# Patient Record
Sex: Female | Born: 2000 | Race: Black or African American | Hispanic: No | Marital: Single | Smoking: Never smoker
Health system: Southern US, Community
[De-identification: ages and names within clinical notes are randomized; demographics above are authoritative.]

---
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
## 2022-01-06 ENCOUNTER — Emergency Department
Admission: EM | Admit: 2022-01-06 | Discharge: 2022-01-06 | Disposition: A | Discharge status: Home / Self Care | Payer: Federal, State, Local not specified - PPO | Source: Home / Self Care | Attending: Family Medicine | Admitting: Family Medicine

## 2022-01-06 ENCOUNTER — Emergency Department (INDEPENDENT_AMBULATORY_CARE_PROVIDER_SITE_OTHER): Payer: Federal, State, Local not specified - PPO

## 2022-01-06 ENCOUNTER — Other Ambulatory Visit: Payer: Self-pay

## 2022-01-06 DIAGNOSIS — R109 Unspecified abdominal pain: Secondary | ICD-10-CM

## 2022-01-06 DIAGNOSIS — R1084 Generalized abdominal pain: Secondary | ICD-10-CM

## 2022-01-06 DIAGNOSIS — R110 Nausea: Secondary | ICD-10-CM | POA: Diagnosis not present

## 2022-01-06 DIAGNOSIS — K5900 Constipation, unspecified: Secondary | ICD-10-CM

## 2022-01-06 MED ORDER — POLYETHYLENE GLYCOL 3350 17 G PO PACK: 17.0000 g | PACK | Freq: Every day | ORAL | 0 refills | Status: DC

## 2022-01-06 MED ORDER — ONDANSETRON HCL 8 MG PO TABS: 8.0000 mg | ORAL_TABLET | Freq: Three times a day (TID) | ORAL | 0 refills | Status: DC | PRN

## 2022-01-06 NOTE — Discharge Instructions (Addendum)
Start taking MiraLAX once a day every day It is available over-the-counter.  For now I would continue 2-week supply.  The goal is to regulate your bowels (even if you only have a movement every few days) You may cut back on this if you develop loose bowels Take Zofran as needed for nausea Make sure you drink lots of water Increase fiber in your diet See your primary care doctor if bowel problems persist

## 2022-01-06 NOTE — ED Provider Notes (Signed)
[facility]    CSN: [id number] Arrival date & time: 01/06/22  0803      History   Chief Complaint Chief Complaint  Patient presents with   Constipation    X3 weeks  Constipation and nausea.    HPI Brooke Meyer is a 21 y.o. female.   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  States that she is always had irregular bowel habits.  She states that it is not unusual for her to go 1 week in between bowel movements.  She states that this is normal for her, no pain.  For the last 3 weeks she has been having constipation.  She noticed that she missed her weekly bowel movements and started to feel uncomfortable.  She states that she was having abdominal crampy pain and nausea.  She took MiraLAX which did not work.  Her mother gave her a laxative tablet which made her have cramping, she vomited 1 time, but did have a bowel movement.  She has not felt right since.  She has continued to have waves of nausea.  Continue to have crampy abdominal pain.  Continue to have irregular bowel movements.  Is here for evaluation of constipation. No blood or mucus in the bowels She is on no medications or supplements that would cause constipation States she has not changed her diet or water intake Denies fever.  No abdominal surgeries Patient has had a menstrual period within the last 3 weeks, and did a pregnancy test at home that was negative  History reviewed. No pertinent past medical history.  There are no problems to display for this patient.   History reviewed. No pertinent surgical history.  OB History   No obstetric history on file.      Home Medications    Prior to Admission medications   Medication Sig Start Date End Date Taking? Authorizing Provider  ondansetron (ZOFRAN) 8 MG tablet Take 1 tablet (8 mg total) by mouth every 8 (eight) hours as needed for nausea or vomiting. 01/06/22  Yes Eustace Moore, MD  polyethylene glycol (MIRALAX /  GLYCOLAX) 17 g packet Take 17 g by mouth daily. 01/06/22  Yes Eustace Moore, MD  ibuprofen (ADVIL) 200 MG tablet Take three tablets ( 600 milligrams total) every 6 with food as needed for pain. 08/06/19   Lajean Manes, MD    Family History History reviewed. No pertinent family history.  Social History Social History   Tobacco Use   Smoking status: Never   Smokeless tobacco: Never  Vaping Use   Vaping Use: Never used  Substance Use Topics   Alcohol use: Yes     Allergies   Patient has no known allergies.   Review of Systems Review of Systems See HPI  Physical Exam Triage Vital Signs ED Triage Vitals  Enc Vitals Group     BP 01/06/22 0822 134/83     Pulse Rate 01/06/22 0822 81     Resp 01/06/22 0822 20     Temp 01/06/22 0822 98.3 F (36.8 C)     Temp Source 01/06/22 0822 Oral     SpO2 01/06/22 0822 98 %     Weight 01/06/22 0819 180 lb (81.6 kg)     Height 01/06/22 0819 5\' 9"  (1.753 m)     Head Circumference --      Peak Flow --      Pain Score 01/06/22 0819 0     Pain Loc --  Pain Edu? --      Excl. in GC? --    No data found.  Updated Vital Signs BP 134/83 (BP Location: Right Arm)    Pulse 81    Temp 98.3 F (36.8 C) (Oral)    Resp 20    Ht 5\' 9"  (1.753 m)    Wt 81.6 kg    LMP 12/31/2021    SpO2 98%    BMI 26.58 kg/m        Physical Exam Constitutional:      General: She is not in acute distress.    Appearance: She is well-developed and normal weight. She is not ill-appearing.  HENT:     Head: Normocephalic and atraumatic.     Mouth/Throat:     Comments: Mask is in place Eyes:     Conjunctiva/sclera: Conjunctivae normal.     Pupils: Pupils are equal, round, and reactive to light.  Cardiovascular:     Rate and Rhythm: Normal rate and regular rhythm.     Heart sounds: Normal heart sounds.  Pulmonary:     Effort: Pulmonary effort is normal. No respiratory distress.     Breath sounds: Normal breath sounds.  Abdominal:     General: Abdomen  is flat. Bowel sounds are normal. There is no distension.     Palpations: Abdomen is soft.     Tenderness: There is abdominal tenderness.     Comments: Generalized tenderness to deep palpation throughout abdomen.  No organomegaly.  No palpable mass  Musculoskeletal:        General: Normal range of motion.     Cervical back: Normal range of motion and neck supple.  Skin:    General: Skin is warm and dry.  Neurological:     Mental Status: She is alert.  Psychiatric:        Mood and Affect: Mood normal.        Behavior: Behavior normal.     UC Treatments / Results  Labs (all labs ordered are listed, but only abnormal results are displayed) Labs Reviewed - No data to display  EKG   Radiology DG Abdomen Acute W/Chest  Result Date: 01/06/2022 CLINICAL DATA:  Abdominal pain and nausea EXAM: DG ABDOMEN ACUTE WITH 1 VIEW CHEST COMPARISON:  None. FINDINGS: There is no evidence of dilated bowel loops or free intraperitoneal air. No radiopaque calculi or other significant radiographic abnormality is seen. Heart size and mediastinal contours are within normal limits. Both lungs are clear. IMPRESSION: Negative abdominal radiographs.  No acute cardiopulmonary disease. Electronically Signed   By: 03/06/2022.  Shick M.D.   On: 01/06/2022 09:02    Procedures Procedures (including critical care time)  Medications Ordered in UC Medications - No data to display  Initial Impression / Assessment and Plan / UC Course  I have reviewed the triage vital signs and the nursing notes.  Pertinent labs & imaging results that were available during my care of the patient were reviewed by me and considered in my medical decision making (see chart for details).     Discussed management of constipation.  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  She may have irritable bowel with constipation.  Recommend follow-up with primary care Final Clinical Impressions(s) / UC Diagnoses   Final diagnoses:  Generalized  abdominal pain  Constipation, unspecified constipation type     Discharge Instructions      Start taking MiraLAX once a day every day It is available over-the-counter.  For now I  would continue 2-week supply.  The goal is to regulate your bowels (even if you only have a movement every few days) You may cut back on this if you develop loose bowels Take Zofran as needed for nausea Make sure you drink lots of water Increase fiber in your diet See your primary care doctor if bowel problems persist   ED Prescriptions     Medication Sig Dispense Auth. Provider   polyethylene glycol (MIRALAX / GLYCOLAX) 17 g packet Take 17 g by mouth daily. 14 each Eustace MooreNelson, Jamyra Zweig Sue, MD   ondansetron (ZOFRAN) 8 MG tablet Take 1 tablet (8 mg total) by mouth every 8 (eight) hours as needed for nausea or vomiting. 20 tablet Eustace MooreNelson, Daissy Yerian Sue, MD      PDMP not reviewed this encounter.   Eustace MooreNelson, Jhania Etherington Sue, MD 01/06/22 1012

## 2022-01-06 NOTE — ED Triage Notes (Signed)
Pt states that she has some constipation and nausea. X3 weeks   Pt states that she has taken a pregnancy on 1/4 which  was negative.  Pt states that her bowels are irregular.

## 2022-09-14 ENCOUNTER — Encounter: Payer: Self-pay | Admitting: Allergy

## 2022-09-14 ENCOUNTER — Ambulatory Visit: Payer: Federal, State, Local not specified - PPO | Admitting: Allergy

## 2022-09-14 VITALS — BP 120/80 | HR 69 | Temp 98.1°F | Resp 16 | Ht 69.5 in | Wt 133.0 lb

## 2022-09-14 DIAGNOSIS — R21 Rash and other nonspecific skin eruption: Secondary | ICD-10-CM | POA: Diagnosis not present

## 2022-09-14 DIAGNOSIS — T781XXA Other adverse food reactions, not elsewhere classified, initial encounter: Secondary | ICD-10-CM

## 2022-09-14 DIAGNOSIS — L509 Urticaria, unspecified: Secondary | ICD-10-CM | POA: Diagnosis not present

## 2022-09-14 DIAGNOSIS — T781XXD Other adverse food reactions, not elsewhere classified, subsequent encounter: Secondary | ICD-10-CM

## 2022-09-14 DIAGNOSIS — J302 Other seasonal allergic rhinitis: Secondary | ICD-10-CM | POA: Insufficient documentation

## 2022-09-14 DIAGNOSIS — T7840XA Allergy, unspecified, initial encounter: Secondary | ICD-10-CM | POA: Insufficient documentation

## 2022-09-14 DIAGNOSIS — T7840XD Allergy, unspecified, subsequent encounter: Secondary | ICD-10-CM

## 2022-09-14 DIAGNOSIS — J3089 Other allergic rhinitis: Secondary | ICD-10-CM

## 2022-09-14 MED ORDER — EPINEPHRINE 0.3 MG/0.3ML IJ SOAJ: 0.3000 mg | INTRAMUSCULAR | 1 refills | Status: AC | PRN

## 2022-09-14 NOTE — Assessment & Plan Note (Signed)
Minor rhinitis symptoms in the spring and fall.  Today's skin testing showed: Positive to grass, trees, weed, mold, cat, dog.  Start environmental control measures as below.  Use over the counter antihistamines such as Zyrtec (cetirizine), Claritin (loratadine), Allegra (fexofenadine), or Xyzal (levocetirizine) daily as needed. May take twice a day during allergy flares. May switch antihistamines every few months.  Consider allergy injections for long term control if above medications do not help the symptoms - handout given.   Patient wants to get a dog.

## 2022-09-14 NOTE — Progress Notes (Signed)
New Patient Note  RE: Brooke Meyer MRN: [id number] DOB: [birth year] Date of Office Visit: 09/14/2022  Consult requested by: Deloris Ping, NP Primary care provider: Pcp, No  Chief Complaint: Allergic Reaction (Random reactions to random foods did have throat closing to gatorade)  History of Present Illness: I had the pleasure of seeing Brooke Meyer for initial evaluation at the [facility] on 09/14/2022. She is a 21 y.o. female, who is referred here by Fredirick Maudlin, NP for the evaluation of allergic reactions.  Patient had pancakes, syrup, coffee for breakfast in September and about 30 minutes afterwards she had lip swelling. She had some facial itching/erythema.  These are foods that she usually eats daily but since then she has not had it.   Symptoms resolved within 2 hours after taking zyrtec.  Denies any changes in diet, meds, personal care products.  She also noted trouble breathing/throat tightness with Gatorade. This started happening about 2 years ago. She had 2 episodes with Gatorade. She usually feels better after 1 day. No recent Gatorade/powerade.  She tolerates food dyes in candy with no issues.   Past work up includes: none. Dietary History: patient has been eating other foods including milk, eggs, peanut, treenuts, sesame, shellfish, fish, soy, wheat, meats, fruits and vegetables.  She reports reading labels and avoiding Gatorade/powerade in diet completely.   Previous work up includes: none. Previous history of swelling: no. Family history of angioedema: no. Ace-inhibitor use: no  Assessment and Plan: Brooke Meyer is a 21 y.o. female with: Allergic reaction Lip angioedema with facial itching/rash after eating breakfast. Symptoms resolved within 2 hours after taking zyrtec. Denies changes in diet, meds, personal are products. No prior lip angioedema. Noted trouble breathing/throat tightness with any Gatorade drinks but tolerates food dyes with no  issues. Sometimes also breaks out on her face with no known triggers. Concerned about allergies. Today's skin testing showed: Positive to grass, trees, weed, mold, cat, dog. Negative to food panel. Not sure what caused the above reaction.  Keep track of episodes and take pictures. See below for proper skin care.  Take zyrtec 10mg  one a day at night.  If becoming more frequent then get bloodwork drawn. Start strict avoidance of powerade/gatorade. I don't know what ingredient you are sensitive to as some of the chemicals/additives I don't have a test for. I have prescribed epinephrine injectable device and demonstrated proper use. For mild symptoms you can take over the counter antihistamines such as Benadryl and monitor symptoms closely. If symptoms worsen or if you have severe symptoms including breathing issues, throat closure, significant swelling, whole body hives, severe diarrhea and vomiting, lightheadedness then inject epinephrine and seek immediate medical care afterwards. Emergency action plan given.  Other allergic rhinitis Minor rhinitis symptoms in the spring and fall. Today's skin testing showed: Positive to grass, trees, weed, mold, cat, dog. Start environmental control measures as below. Use over the counter antihistamines such as Zyrtec (cetirizine), Claritin (loratadine), Allegra (fexofenadine), or Xyzal (levocetirizine) daily as needed. May take twice a day during allergy flares. May switch antihistamines every few months. Consider allergy injections for long term control if above medications do not help the symptoms - handout given.  Patient wants to get a dog.   Return in about 6 months (around 03/15/2023).  Meds ordered this encounter  Medications   EPINEPHrine 0.3 mg/0.3 mL IJ SOAJ injection    Sig: Inject 0.3 mg into the muscle as needed for anaphylaxis.  Dispense:  2 each    Refill:  1    May dispense generic/Mylan/Teva brand.   Lab Orders         ANA w/Reflex          Alpha-Gal Panel         C3 and C4         CBC with Differential/Platelet         Chronic Urticaria         Comprehensive metabolic panel         C-reactive protein         Sedimentation rate         Thyroid Cascade Profile         Tryptase      Other allergy screening: Asthma:  Uses albuterol as needed on rare occasions. Last use was about 2 years ago.  Rhino conjunctivitis: yes Sneezing around spring and fall.  Does not take any medications for this. No prior testing.  Medication allergy: no Hymenoptera allergy: no Urticaria: yes Breaking out on the face and usually happens once per week.  It happens more so often after she is eating.  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  History of recurrent infections suggestive of immunodeficency: no  Diagnostics: Skin Testing: Environmental allergy panel and foods. Positive to grass, trees, weed, mold, cat, dog. Negative to food panel. Results discussed with patient/family.  Airborne Adult Perc - 09/14/22 1134     Time Antigen Placed 1135    Allergen Manufacturer Waynette ButteryGreer    Location Back    Number of Test 59    1. Control-Buffer 50% Glycerol Negative    2. Control-Histamine 1 mg/ml 2+    3. Albumin saline Negative    4. Bahia Negative    5. French Southern TerritoriesBermuda Negative    6. Johnson Negative    7. Kentucky Blue Negative    8. Meadow Fescue Negative    9. Perennial Rye Negative    10. Sweet Vernal Negative    11. Timothy Negative    12. Cocklebur Negative    13. Burweed Marshelder Negative    14. Ragweed, short Negative    15. Ragweed, Giant Negative    16. Plantain,  English Negative    17. Lamb's Quarters Negative    18. Sheep Sorrell Negative    19. Rough Pigweed Negative    20. Marsh Elder, Rough Negative    21. Mugwort, Common Negative    22. Ash mix Negative    23. Birch mix Negative    24. Beech American Negative    25. Box, Elder 2+    26. Cedar, red Negative    27.  Cottonwood, Guinea-BissauEastern Negative    28. Elm mix Negative    29. Hickory Negative    30. Maple mix Negative    31. Oak, Guinea-BissauEastern mix 4+    32. Pecan Pollen --   +/-   33. Pine mix Negative    34. Sycamore Eastern Negative    35. Walnut, Black Pollen --   +/-   36. Alternaria alternata 2+    37. Cladosporium Herbarum 2+    38. Aspergillus mix --   +/-   39. Penicillium mix Negative    40. Bipolaris sorokiniana (Helminthosporium) Negative    41. Drechslera spicifera (Curvularia) 2+    42. Mucor plumbeus Negative    43. Fusarium moniliforme 3+    44. Aureobasidium pullulans (pullulara) Negative    45.  Rhizopus oryzae Negative    46. Botrytis cinera Negative    47. Epicoccum nigrum 2+    48. Phoma betae 3+    49. Candida Albicans Negative    50. Trichophyton mentagrophytes Negative    51. Mite, D Farinae  5,000 AU/ml Negative    52. Mite, D Pteronyssinus  5,000 AU/ml Negative    53. Cat Hair 10,000 BAU/ml 2+    54.  Dog Epithelia Negative    55. Mixed Feathers Negative    56. Horse Epithelia Negative    57. Cockroach, German Negative    58. Mouse Negative    59. Tobacco Leaf Negative             Intradermal - 09/14/22 1214     Time Antigen Placed 1215    Allergen Manufacturer Waynette Buttery    Location Arm    Number of Test 9    Control Negative    French Southern Territ[place] 2+    Johnson 2+    7 Grass Negative    Ragweed mix Negative    Weed mix 2+    Dog 2+    Cockroach Negative    Mite mix Negative             Food Adult Perc - 09/14/22 1100     Time Antigen Placed 1135    Allergen Manufacturer Waynette Buttery    Location Back    Number of allergen test 72    1. Peanut Negative    2. Soybean Negative    3. Wheat Negative    4. Sesame Negative    5. Milk, cow Negative    6. Egg White, Chicken Negative    7. Casein Negative    8. Shellfish Mix Negative    9. Fish Mix Negative    10. Cashew Negative    11. Pecan Food Negative    12. Walnut Food Negative    13. Almond Negative    14.  Hazelnut Negative    15. [place] nut Negative    16. Coconut Negative    17. Pistachio Negative    18. Catfish Negative    19. Bass Negative    20. Trout Negative    21. Tuna Negative    22. Salmon Negative    23. Flounder Negative    24. Codfish Negative    25. Shrimp Negative    26. Crab Negative    27. Lobster Negative    28. Oyster Negative    29. Scallops Negative    30. Barley Negative    31. Oat  Negative    32. Rye  Negative    33. Hops Negative    34. Rice Negative    35. Cottonseed Negative    36. Saccharomyces Cerevisiae  Negative    37. Pork Negative    38. [place] Meat Negative    39. Chicken Meat Negative    40. Beef Negative    41. Lamb Negative    42. Tomato Negative    43. White Potato Negative    44. Sweet Potato Negative    45. Pea, Green/English Negative    46. Navy Bean Negative    47. Mushrooms Negative    48. Avocado Negative    49. Onion Negative    50. Cabbage Negative    51. Carrots Negative    52. Celery Negative    53. Corn Negative    54. Cucumber Negative    55. Grape (White seedless) Negative  56. Orange  Negative    57. Banana Negative    58. Apple Negative    59. Peach Negative    60. Strawberry Negative    61. Cantaloupe Negative    62. Watermelon Negative    63. Pineapple Negative    64. Chocolate/Cacao bean Negative    65. Karaya Gum Negative    66. Acacia (Arabic Gum) Negative    67. Cinnamon Negative    68. Nutmeg Negative    69. Ginger Negative    70. Garlic Negative    71. Pepper, black Negative    72. Mustard Negative             Past Medical History: Patient Active Problem List   Diagnosis Date Noted   Allergic reaction 09/14/2022   Other allergic rhinitis 09/14/2022   History reviewed. No pertinent past medical history. Past Surgical History: History reviewed. No pertinent surgical history. Medication List:  Current Outpatient Medications  Medication Sig Dispense Refill   EPINEPHrine 0.3 mg/0.3 mL  IJ SOAJ injection Inject 0.3 mg into the muscle as needed for anaphylaxis. 2 each 1   ibuprofen (ADVIL) 200 MG tablet Take three tablets ( 600 milligrams total) every 6 with food as needed for pain. 30 tablet 0   No current facility-administered medications for this visit.   Allergies: No Known Allergies Social History: Social History   Socioeconomic History   Marital status: Single    Spouse name: Not on file   Number of children: Not on file   Years of education: Not on file   Highest education level: Not on file  Occupational History   Not on file  Tobacco Use   Smoking status: Never    Passive exposure: Never   Smokeless tobacco: Never  Vaping Use   Vaping Use: Never used  Substance and Sexual Activity   Alcohol use: Never   Drug use: Not on file   Sexual activity: Not on file  Other Topics Concern   Not on file  Social History Narrative   Not on file   Social Determinants of Health   Financial Resource Strain: Not on file  Food Insecurity: Not on file  Transportation Needs: Not on file  Physical Activity: Not on file  Stress: Not on file  Social Connections: Not on file   Lives in a 20 year old house. Smoking: denies Occupation: Scientist, clinical (histocompatibility and immunogenetics) HistorySurveyor, minerals in the house: no Engineer, civil (consulting) in the family room: yes Carpet in the bedroom: no Heating: gas Cooling: central Pet: no  Family History: Family History  Problem Relation Age of Onset   Food Allergy Maternal Uncle    Allergic rhinitis Neg Hx    Angioedema Neg Hx    Asthma Neg Hx    Atopy Neg Hx    Eczema Neg Hx    Immunodeficiency Neg Hx    Urticaria Neg Hx    Review of Systems  Constitutional:  Negative for appetite change, chills, fever and unexpected weight change.  HENT:  Negative for congestion and rhinorrhea.   Eyes:  Negative for itching.  Respiratory:  Negative for cough, chest tightness, shortness of breath and wheezing.   Cardiovascular:  Negative for chest  pain.  Gastrointestinal:  Negative for abdominal pain.  Genitourinary:  Negative for difficulty urinating.  Skin:  Positive for rash.  Allergic/Immunologic: Positive for environmental allergies.  Neurological:  Negative for headaches.    Objective: BP 120/80   Pulse 69   Temp 98.1  F (36.7 C) (Temporal)   Resp 16   Ht 5' 9.5" (1.765 m)   Wt 133 lb (60.3 kg)   SpO2 99%   BMI 19.36 kg/m  Body mass index is 19.36 kg/m. Physical Exam Vitals and nursing note reviewed.  Constitutional:      Appearance: Normal appearance. She is well-developed.  HENT:     Head: Normocephalic and atraumatic.     Right Ear: External ear normal. There is impacted cerumen.     Left Ear: External ear normal. There is impacted cerumen.     Nose: Nose normal.     Mouth/Throat:     Mouth: Mucous membranes are moist.     Pharynx: Oropharynx is clear.  Eyes:     Conjunctiva/sclera: Conjunctivae normal.  Cardiovascular:     Rate and Rhythm: Normal rate and regular rhythm.     Heart sounds: Normal heart sounds. No murmur heard.    No friction rub. No gallop.  Pulmonary:     Effort: Pulmonary effort is normal.     Breath sounds: Normal breath sounds. No wheezing, rhonchi or rales.  Musculoskeletal:     Cervical back: Neck supple.  Skin:    General: Skin is warm.     Findings: No rash.  Neurological:     Mental Status: She is alert and oriented to person, place, and time.  Psychiatric:        Behavior: Behavior normal.    The plan was reviewed with the patient/family, and all questions/concerned were addressed.  It was my pleasure to see Brooke Meyer today and participate in her care. Please feel free to contact me with any questions or concerns.  Sincerely,  Rexene Alberts, DO Allergy & Immunology  [facility] office: [place] office: [phone]

## 2022-09-14 NOTE — Patient Instructions (Addendum)
Today's skin testing showed: Positive to grass, trees, weed, mold, cat, dog. Negative to food panel.  Results given.  Lip welling/facial rash Not sure what caused the symptoms.  Keep track of episodes and take pictures. See below for proper skin care.  Take zyrtec 10mg  one a day at night.  If becoming more frequent then get bloodwork drawn.  Food allergies Start strict avoidance of powerade/gatorade. I don't know what ingredient you are sensitive to as some of the chemicals/additives I don't have a test for. I have prescribed epinephrine injectable device and demonstrated proper use. For mild symptoms you can take over the counter antihistamines such as Benadryl and monitor symptoms closely. If symptoms worsen or if you have severe symptoms including breathing issues, throat closure, significant swelling, whole body hives, severe diarrhea and vomiting, lightheadedness then inject epinephrine and seek immediate medical care afterwards. Emergency action plan given.  Environmental allergies Start environmental control measures as below. Use over the counter antihistamines such as Zyrtec (cetirizine), Claritin (loratadine), Allegra (fexofenadine), or Xyzal (levocetirizine) daily as needed. May take twice a day during allergy flares. May switch antihistamines every few months. Consider allergy injections for long term control if above medications do not help the symptoms - handout given.   Follow up in 6 months or sooner if needed.    Skin care recommendations  Bath time: Always use lukewarm water. AVOID very hot or cold water. Keep bathing time to 5-10 minutes. Do NOT use bubble bath. Use a mild soap and use just enough to wash the dirty areas. Do NOT scrub skin vigorously.  After bathing, pat dry your skin with a towel. Do NOT rub or scrub the skin.  Moisturizers and prescriptions:  ALWAYS apply moisturizers immediately after bathing (within 3 minutes). This helps to lock-in  moisture. Use the moisturizer several times a day over the whole body. Good summer moisturizers include: Aveeno, CeraVe, Cetaphil. Good winter moisturizers include: Aquaphor, Vaseline, Cerave, Cetaphil, Eucerin, Vanicream. When using moisturizers along with medications, the moisturizer should be applied about one hour after applying the medication to prevent diluting effect of the medication or moisturize around where you applied the medications. When not using medications, the moisturizer can be continued twice daily as maintenance.  Laundry and clothing: Avoid laundry products with added color or perfumes. Use unscented hypo-allergenic laundry products such as Tide free, Cheer free & gentle, and All free and clear.  If the skin still seems dry or sensitive, you can try double-rinsing the clothes. Avoid tight or scratchy clothing such as wool. Do not use fabric softeners or dyer sheets.  Reducing Pollen Exposure Pollen seasons: trees (spring), grass (summer) and ragweed/weeds (fall). Keep windows closed in your home and car to lower pollen exposure.  Install air conditioning in the bedroom and throughout the house if possible.  Avoid going out in dry windy days - especially early morning. Pollen counts are highest between 5 - 10 AM and on dry, hot and windy days.  Save outside activities for late afternoon or after a heavy rain, when pollen levels are lower.  Avoid mowing of grass if you have grass pollen allergy. Be aware that pollen can also be transported indoors on people and pets.  Dry your clothes in an automatic dryer rather than hanging them outside where they might collect pollen.  Rinse hair and eyes before bedtime. Pet Allergen Avoidance: Contrary to popular opinion, there are no "hypoallergenic" breeds of dogs or cats. That is because people are not allergic to  an animal's hair, but to an allergen found in the animal's saliva, dander (dead skin flakes) or urine. Pet allergy  symptoms typically occur within minutes. For some people, symptoms can build up and become most severe 8 to 12 hours after contact with the animal. People with severe allergies can experience reactions in public places if dander has been transported on the pet owners' clothing. Keeping an animal outdoors is only a partial solution, since homes with pets in the yard still have higher concentrations of animal allergens. Before getting a pet, ask your allergist to determine if you are allergic to animals. If your pet is already considered part of your family, try to minimize contact and keep the pet out of the bedroom and other rooms where you spend a great deal of time. As with dust mites, vacuum carpets often or replace carpet with a hardwood floor, tile or linoleum. High-efficiency particulate air (HEPA) cleaners can reduce allergen levels over time. While dander and saliva are the source of cat and dog allergens, urine is the source of allergens from rabbits, hamsters, mice and [place] pigs; so ask a non-allergic family member to clean the animal's cage. If you have a pet allergy, talk to your allergist about the potential for allergy immunotherapy (allergy shots). This strategy can often provide long-term relief. Mold Control Mold and fungi can grow on a variety of surfaces provided certain temperature and moisture conditions exist.  Outdoor molds grow on plants, decaying vegetation and soil. The major outdoor mold, Alternaria and Cladosporium, are found in very high numbers during hot and dry conditions. Generally, a late summer - fall peak is seen for common outdoor fungal spores. Rain will temporarily lower outdoor mold spore count, but counts rise rapidly when the rainy period ends. The most important indoor molds are Aspergillus and Penicillium. Dark, humid and poorly ventilated basements are ideal sites for mold growth. The next most common sites of mold growth are the bathroom and the  kitchen. Outdoor (Seasonal) Mold Control Use air conditioning and keep windows closed. Avoid exposure to decaying vegetation. Avoid leaf raking. Avoid grain handling. Consider wearing a face mask if working in moldy areas.  Indoor (Perennial) Mold Control  Maintain humidity below 50%. Get rid of mold growth on hard surfaces with water, detergent and, if necessary, 5% bleach (do not mix with other cleaners). Then dry the area completely. If mold covers an area more than 10 square feet, consider hiring an indoor environmental professional. For clothing, washing with soap and water is best. If moldy items cannot be cleaned and dried, throw them away. Remove sources e.g. contaminated carpets. Repair and seal leaking roofs or pipes. Using dehumidifiers in damp basements may be helpful, but empty the water and clean units regularly to prevent mildew from forming. All rooms, especially basements, bathrooms and kitchens, require ventilation and cleaning to deter mold and mildew growth. Avoid carpeting on concrete or damp floors, and storing items in damp areas.

## 2022-09-14 NOTE — Assessment & Plan Note (Addendum)
Lip angioedema with facial itching/rash after eating breakfast. Symptoms resolved within 2 hours after taking zyrtec. Denies changes in diet, meds, personal are products. No prior lip angioedema. Noted trouble breathing/throat tightness with any Gatorade drinks but tolerates food dyes with no issues. Sometimes also breaks out on her face with no known triggers. Concerned about allergies.  Today's skin testing showed: Positive to grass, trees, weed, mold, cat, dog. Negative to food panel. . Not sure what caused the above reaction.  Marland Kitchen Keep track of episodes and take pictures. . See below for proper skin care.  . Take zyrtec 10mg  one a day at night.  . If becoming more frequent then get bloodwork drawn.  Start strict avoidance of powerade/gatorade.  I don't know what ingredient you are sensitive to as some of the chemicals/additives I don't have a test for.  I have prescribed epinephrine injectable device and demonstrated proper use. For mild symptoms you can take over the counter antihistamines such as Benadryl and monitor symptoms closely. If symptoms worsen or if you have severe symptoms including breathing issues, throat closure, significant swelling, whole body hives, severe diarrhea and vomiting, lightheadedness then inject epinephrine and seek immediate medical care afterwards.  Emergency action plan given.

## 2023-03-17 ENCOUNTER — Ambulatory Visit: Payer: Federal, State, Local not specified - PPO | Admitting: Allergy

## 2023-04-11 NOTE — Progress Notes (Unsigned)
Follow Up Note  RE: Brooke Meyer MRN: [id number] DOB: 18-Jul-2001 Date of Office Visit: 04/12/2023  Referring provider: Jones Bales, MD Primary care provider: Jones Bales, MD  Chief Complaint: No chief complaint on file.  History of Present Illness: I had the pleasure of seeing Brooke Meyer for a follow up visit at the [facility] on 04/11/2023. She is a 22 y.o. female, who is being followed for allergic reaction and allergic rhinitis. Her previous allergy office visit was on 09/14/2022 with Dr. Selena Batten. Today is a regular follow up visit.  Allergic reaction Lip angioedema with facial itching/rash after eating breakfast. Symptoms resolved within 2 hours after taking zyrtec. Denies changes in diet, meds, personal are products. No prior lip angioedema. Noted trouble breathing/throat tightness with any Gatorade drinks but tolerates food dyes with no issues. Sometimes also breaks out on her face with no known triggers. Concerned about allergies. Today's skin testing showed: Positive to grass, trees, weed, mold, cat, dog. Negative to food panel. Not sure what caused the above reaction.  Keep track of episodes and take pictures. See below for proper skin care.  Take zyrtec  one a day at night.  If becoming more frequent then get bloodwork drawn. Start strict avoidance of powerade/gatorade. I don't know what ingredient you are sensitive to as some of the chemicals/additives I don't have a test for. I have prescribed epinephrine injectable device and demonstrated proper use. For mild symptoms you can take over the counter antihistamines such as Benadryl and monitor symptoms closely. If symptoms worsen or if you have severe symptoms including breathing issues, throat closure, significant swelling, whole body hives, severe diarrhea and vomiting, lightheadedness then inject epinephrine and seek immediate medical care afterwards. Emergency action plan given.   Other  allergic rhinitis Minor rhinitis symptoms in the spring and fall. Today's skin testing showed: Positive to grass, trees, weed, mold, cat, dog. Start environmental control measures as below. Use over the counter antihistamines such as Zyrtec (cetirizine), Claritin (loratadine), Allegra (fexofenadine), or Xyzal (levocetirizine) daily as needed. May take twice a day during allergy flares. May switch antihistamines every few months. Consider allergy injections for long term control if above medications do not help the symptoms - handout given.  Patient wants to get a dog.    Return in about 6 months (around 03/15/2023).  Assessment and Plan: Brooke Meyer is a 22 y.o. female with: No problem-specific Assessment & Plan notes found for this encounter.  No follow-ups on file.  No orders of the defined types were placed in this encounter.  Lab Orders  No laboratory test(s) ordered today    Diagnostics: Spirometry:  Tracings reviewed. Her effort: {Blank single:19197::"Good reproducible efforts.","It was hard to get consistent efforts and there is a question as to whether this reflects a maximal maneuver.","Poor effort, data can not be interpreted."} FVC: ***L FEV1: ***L, ***% predicted FEV1/FVC ratio: ***% Interpretation: {Blank single:19197::"Spirometry consistent with mild obstructive disease","Spirometry consistent with moderate obstructive disease","Spirometry consistent with severe obstructive disease","Spirometry consistent with possible restrictive disease","Spirometry consistent with mixed obstructive and restrictive disease","Spirometry uninterpretable due to technique","Spirometry consistent with normal pattern","No overt abnormalities noted given today's efforts"}.  Please see scanned spirometry results for details.  Skin Testing: {Blank single:19197::"Select foods","Environmental allergy panel","Environmental allergy panel and select foods","Food allergy panel","None","Deferred due to  recent antihistamines use"}. *** Results discussed with patient/family.   Medication List:  Current Outpatient Medications  Medication Sig Dispense Refill  . EPINEPHrine 0.3 mg/0.3 mL IJ SOAJ injection  Inject 0.3 mg into the muscle as needed for anaphylaxis. 2 each 1  . ibuprofen (ADVIL) 200 MG tablet Take three tablets ( 600 milligrams total) every 6 with food as needed for pain. 30 tablet 0   No current facility-administered medications for this visit.   Allergies: No Known Allergies I reviewed her past medical history, social history, family history, and environmental history and no significant changes have been reported from her previous visit.  Review of Systems  Constitutional:  Negative for appetite change, chills, fever and unexpected weight change.  HENT:  Negative for congestion and rhinorrhea.   Eyes:  Negative for itching.  Respiratory:  Negative for cough, chest tightness, shortness of breath and wheezing.   Cardiovascular:  Negative for chest pain.  Gastrointestinal:  Negative for abdominal pain.  Genitourinary:  Negative for difficulty urinating.  Skin:  Positive for rash.  Allergic/Immunologic: Positive for environmental allergies.  Neurological:  Negative for headaches.   Objective: There were no vitals taken for this visit. There is no height or weight on file to calculate BMI. Physical Exam Vitals and nursing note reviewed.  Constitutional:      Appearance: Normal appearance. She is well-developed.  HENT:     Head: Normocephalic and atraumatic.     Right Ear: External ear normal. There is impacted cerumen.     Left Ear: External ear normal. There is impacted cerumen.     Nose: Nose normal.     Mouth/Throat:     Mouth: Mucous membranes are moist.     Pharynx: Oropharynx is clear.  Eyes:     Conjunctiva/sclera: Conjunctivae normal.  Cardiovascular:     Rate and Rhythm: Normal rate and regular rhythm.     Heart sounds: Normal heart sounds. No murmur  heard.    No friction rub. No gallop.  Pulmonary:     Effort: Pulmonary effort is normal.     Breath sounds: Normal breath sounds. No wheezing, rhonchi or rales.  Musculoskeletal:     Cervical back: Neck supple.  Skin:    General: Skin is warm.     Findings: No rash.  Neurological:     Mental Status: She is alert and oriented to person, place, and time.  Psychiatric:        Behavior: Behavior normal.  Previous notes and tests were reviewed. The plan was reviewed with the patient/family, and all questions/concerned were addressed.  It was my pleasure to see Irelan today and participate in her care. Please feel free to contact me with any questions or concerns.  Sincerely,  Wyline Mood, DO Allergy & Immunology  [facility] office: [phone] [facility] office: [phone]

## 2023-04-12 ENCOUNTER — Encounter: Payer: Self-pay | Admitting: Allergy

## 2023-04-12 ENCOUNTER — Ambulatory Visit: Payer: Federal, State, Local not specified - PPO | Admitting: Allergy

## 2023-04-12 ENCOUNTER — Other Ambulatory Visit: Payer: Self-pay

## 2023-04-12 VITALS — BP 120/70 | HR 74 | Temp 98.0°F | Resp 20 | Ht 69.0 in | Wt 185.0 lb

## 2023-04-12 DIAGNOSIS — J4530 Mild persistent asthma, uncomplicated: Secondary | ICD-10-CM | POA: Insufficient documentation

## 2023-04-12 DIAGNOSIS — J3089 Other allergic rhinitis: Secondary | ICD-10-CM

## 2023-04-12 DIAGNOSIS — J302 Other seasonal allergic rhinitis: Secondary | ICD-10-CM | POA: Diagnosis not present

## 2023-04-12 DIAGNOSIS — T7840XD Allergy, unspecified, subsequent encounter: Secondary | ICD-10-CM | POA: Diagnosis not present

## 2023-04-12 MED ORDER — FLUTICASONE PROPIONATE 50 MCG/ACT NA SUSP: 1.0000 | Freq: Two times a day (BID) | NASAL | 5 refills | Status: AC | PRN

## 2023-04-12 MED ORDER — ARNUITY ELLIPTA 100 MCG/ACT IN AEPB: 1.0000 | INHALATION_SPRAY | Freq: Every day | RESPIRATORY_TRACT | 3 refills | Status: AC

## 2023-04-12 NOTE — Patient Instructions (Signed)
Asthma Today's breathing test concerning for asthma.  Daily controller medication(s): start Arnuity 100mcg uff once a day and rinse mouth after each use. Demonstrated proper use.  If not covered let us know! Check pricing on generic Flovent, asmanex, pulmicort, qvar May use albuterol rescue inhaler 2 puffs every 4 to 6 hours as needed for shortness of breath, chest tightness, coughing, and wheezing. Monitor frequency of use.  Breathing control goals:  Full participation in all desired activities (may need albuterol before activity) Albuterol use two times or less a week on average (not counting use with activity) Cough interfering with sleep two times or less a month Oral steroids no more than once a year No hospitalizations   Environmental allergies 2023 skin testing showed:Positive to grass, trees, weed, mold, cat, dog. Continue environmental control measures as below. Use over the counter antihistamines such as Zyrtec (cetirizine), Claritin (loratadine), Allegra (fexofenadine), or Xyzal (levocetirizine) daily as needed. May take twice a day during allergy flares. May switch antihistamines every few months. Use Flonase (fluticasone) nasal spray 1 spray per nostril twice a day as needed for nasal congestion.  Nasal saline spray (i.e., Simply Saline) or nasal saline lavage (i.e., NeilMed) is recommended as needed and prior to medicated nasal sprays. Consider allergy injections for long term control if above medications do not help the symptoms.  Lip swelling Continue to avoid gatorade/powerade If you have another episode let us know.  Follow up in 2 months or sooner if needed.    Reducing Pollen Exposure Pollen seasons: trees (spring), grass (summer) and ragweed/weeds (fall). Keep windows closed in your home and car to lower pollen exposure.  Install air conditioning in the bedroom and throughout the house if possible.  Avoid going out in dry windy days - especially early  morning. Pollen counts are highest between 5 - 10 AM and on dry, hot and windy days.  Save outside activities for late afternoon or after a heavy rain, when pollen levels are lower.  Avoid mowing of grass if you have grass pollen allergy. Be aware that pollen can also be transported indoors on people and pets.  Dry your clothes in an automatic dryer rather than hanging them outside where they might collect pollen.  Rinse hair and eyes before bedtime. Pet Allergen Avoidance: Contrary to popular opinion, there are no "hypoallergenic" breeds of dogs or cats. That is because people are not allergic to an animal's hair, but to an allergen found in the animal's saliva, dander (dead skin flakes) or urine. Pet allergy symptoms typically occur within minutes. For some people, symptoms can build up and become most severe 8 to 12 hours after contact with the animal. People with severe allergies can experience reactions in public places if dander has been transported on the pet owners' clothing. Keeping an animal outdoors is only a partial solution, since homes with pets in the yard still have higher concentrations of animal allergens. Before getting a pet, ask your allergist to determine if you are allergic to animals. If your pet is already considered part of your family, try to minimize contact and keep the pet out of the bedroom and other rooms where you spend a great deal of time. As with dust mites, vacuum carpets often or replace carpet with a hardwood floor, tile or linoleum. High-efficiency particulate air (HEPA) cleaners can reduce allergen levels over time. While dander and saliva are the source of cat and dog allergens, urine is the source of allergens from rabbits, hamsters, mice  and [place] pigs; so ask a non-allergic family member to clean the animal's cage. If you have a pet allergy, talk to your allergist about the potential for allergy immunotherapy (allergy shots). This strategy can often provide  long-term relief. Mold Control Mold and fungi can grow on a variety of surfaces provided certain temperature and moisture conditions exist.  Outdoor molds grow on plants, decaying vegetation and soil. The major outdoor mold, Alternaria and Cladosporium, are found in very high numbers during hot and dry conditions. Generally, a late summer - fall peak is seen for common outdoor fungal spores. Rain will temporarily lower outdoor mold spore count, but counts rise rapidly when the rainy period ends. The most important indoor molds are Aspergillus and Penicillium. Dark, humid and poorly ventilated basements are ideal sites for mold growth. The next most common sites of mold growth are the bathroom and the kitchen. Outdoor (Seasonal) Mold Control Use air conditioning and keep windows closed. Avoid exposure to decaying vegetation. Avoid leaf raking. Avoid grain handling. Consider wearing a face mask if working in moldy areas.  Indoor (Perennial) Mold Control  Maintain humidity below 50%. Get rid of mold growth on hard surfaces with water, detergent and, if necessary, 5% bleach (do not mix with other cleaners). Then dry the area completely. If mold covers an area more than 10 square feet, consider hiring an indoor environmental professional. For clothing, washing with soap and water is best. If moldy items cannot be cleaned and dried, throw them away. Remove sources e.g. contaminated carpets. Repair and seal leaking roofs or pipes. Using dehumidifiers in damp basements may be helpful, but empty the water and clean units regularly to prevent mildew from forming. All rooms, especially basements, bathrooms and kitchens, require ventilation and cleaning to deter mold and mildew growth. Avoid carpeting on concrete or damp floors, and storing items in damp areas.

## 2023-04-12 NOTE — Assessment & Plan Note (Signed)
No issues with her asthma until 2 weeks ago when she was outdoors for a long time. Noticing chest tightness and using albuterol 1-2 times per week with good benefit. Dog seems to trigger symptoms as well. Today's spirometry showed some mild obstruction with 17% improvement in FEV1 postbronchodilator treatment.  Clinically feeling improved. Daily controller medication(s): start Arnuity 1 puff once a day and rinse mouth after each use. Demonstrated proper use.  If not covered let us know! Check pricing on generic Flovent, asmanex, pulmicort, qvar May use albuterol rescue inhaler 2 puffs every 4 to 6 hours as needed for shortness of breath, chest tightness, coughing, and wheezing. Monitor frequency of use.  Get spirometry at next visit.

## 2023-04-12 NOTE — Assessment & Plan Note (Signed)
Past history - 2023 skin testing showed: Positive to grass, trees, weed, mold, cat, dog. Interim history - some nasal congestion. Continue environmental control measures as below. Use over the counter antihistamines such as Zyrtec (cetirizine), Claritin (loratadine), Allegra (fexofenadine), or Xyzal (levocetirizine) daily as needed. May take twice a day during allergy flares. May switch antihistamines every few months. Use Flonase (fluticasone) nasal spray 1 spray per nostril twice a day as needed for nasal congestion.  Nasal saline spray (i.e., Simply Saline) or nasal saline lavage (i.e., NeilMed) is recommended as needed and prior to medicated nasal sprays. Consider allergy injections for long term control if above medications do not help the symptoms.

## 2023-04-12 NOTE — Assessment & Plan Note (Signed)
Past history - Lip angioedema with facial itching/rash after eating breakfast. Symptoms resolved within 2 hours after taking zyrtec. Denies changes in diet, meds, personal are products. No prior lip angioedema. Noted trouble breathing/throat tightness with any Gatorade drinks but tolerates food dyes with no issues. Sometimes also breaks out on her face with no known triggers. Concerned about allergies. 2023 skin testing showed: Positive to grass, trees, weed, mold, cat, dog. Negative to food panel. Interim history - avoiding powerade/gatorade and no additional episodes.  Not sure what caused the above reaction.  Continue to avoid gatorade/powerade If you have another episode let us know.

## 2023-06-21 ENCOUNTER — Ambulatory Visit: Payer: Federal, State, Local not specified - PPO | Admitting: Allergy

## 2024-03-06 ENCOUNTER — Ambulatory Visit
Admission: RE | Admit: 2024-03-06 | Discharge: 2024-03-06 | Disposition: A | Discharge status: Home / Self Care | Payer: Self-pay | Source: Ambulatory Visit

## 2024-03-06 VITALS — BP 125/83 | HR 78 | Temp 98.0°F | Resp 17

## 2024-03-06 DIAGNOSIS — R051 Acute cough: Secondary | ICD-10-CM

## 2024-03-06 DIAGNOSIS — J019 Acute sinusitis, unspecified: Secondary | ICD-10-CM

## 2024-03-06 DIAGNOSIS — B9689 Other specified bacterial agents as the cause of diseases classified elsewhere: Secondary | ICD-10-CM

## 2024-03-06 DIAGNOSIS — J4530 Mild persistent asthma, uncomplicated: Secondary | ICD-10-CM

## 2024-03-06 MED ORDER — DOXYCYCLINE HYCLATE 100 MG PO CAPS: 100.0000 mg | ORAL_CAPSULE | Freq: Two times a day (BID) | ORAL | 0 refills | Status: AC

## 2024-03-06 MED ORDER — PROMETHAZINE-DM 6.25-15 MG/5ML PO SYRP: 5.0000 mL | ORAL_SOLUTION | Freq: Four times a day (QID) | ORAL | 0 refills | Status: AC | PRN

## 2024-03-06 MED ORDER — AEROCHAMBER PLUS FLO-VU MEDIUM MISC: 0 refills | Status: AC

## 2024-03-06 MED ORDER — ALBUTEROL SULFATE HFA 108 (90 BASE) MCG/ACT IN AERS: 2.0000 | INHALATION_SPRAY | Freq: Four times a day (QID) | RESPIRATORY_TRACT | 1 refills | Status: AC | PRN

## 2024-03-06 NOTE — ED Provider Notes (Signed)
 [facility]    CSN: [id number] Arrival date & time: 03/06/24  0858     History   Chief Complaint Chief Complaint  Patient presents with   Cough   Nasal Congestion    HPI Brooke Meyer is a 23 y.o. female.  2-week history of nasal congestion and somewhat productive cough.  Reports cough wakes her from sleep. No known fevers Has tried mucinex, ibuprofen  She does have history of asthma Not wheezing  LMP just started today  History reviewed. No pertinent past medical history.  Patient Active Problem List   Diagnosis Date Noted   Not well controlled mild persistent asthma 04/12/2023   Allergic reaction 09/14/2022   Seasonal and perennial allergic rhinitis 09/14/2022    History reviewed. No pertinent surgical history.  OB History   No obstetric history on file.      Home Medications    Prior to Admission medications   Medication Sig Start Date End Date Taking? Authorizing Provider  albuterol (VENTOLIN HFA) 108 (90 Base) MCG/ACT inhaler Inhale 2 puffs into the lungs every 6 (six) hours as needed for wheezing or shortness of breath. 03/06/24  Yes Johannah Rozas, Lurena Joiner, PA-C  doxycycline (VIBRAMYCIN) 100 MG capsule Take 1 capsule (100 mg total) by mouth 2 (two) times daily for 7 days. 03/06/24 03/13/24 Yes Hiroko Tregre, Lurena Joiner, PA-C  hydrOXYzine (ATARAX) 10 MG tablet Take by mouth. 09/29/23  Yes [provider]  promethazine-dextromethorphan (PROMETHAZINE-DM) 6.25-15 MG/5ML syrup Take 5 mLs by mouth 4 (four) times daily as needed for cough. 03/06/24  Yes Marcene Laskowski, Lurena Joiner, PA-C  Spacer/Aero-Holding Chambers (AEROCHAMBER PLUS FLO-VU MEDIUM) MISC 1 spacer for use with albuterol inhaler 03/06/24  Yes Khayree Delellis, Lurena Joiner, PA-C  EPINEPHrine 0.3 mg/0.3 mL IJ SOAJ injection Inject 0.3 mg into the muscle as needed for anaphylaxis. 09/14/22   Ellamae Sia, DO  fluticasone (FLONASE) 50 MCG/ACT nasal spray Place 1 spray into both nostrils 2 (two) times daily as needed (nasal  congestion). 04/12/23   Ellamae Sia, DO  Fluticasone Furoate (ARNUITY ELLIPTA) 100 MCG/ACT AEPB Inhale 1 puff into the lungs daily. Rinse mouth after each use. 04/12/23   Ellamae Sia, DO    Family History Family History  Problem Relation Age of Onset   Food Allergy Maternal Uncle    Allergic rhinitis Neg Hx    Angioedema Neg Hx    Asthma Neg Hx    Atopy Neg Hx    Eczema Neg Hx    Immunodeficiency Neg Hx    Urticaria Neg Hx     Social History Social History   Tobacco Use   Smoking status: Never    Passive exposure: Never   Smokeless tobacco: Never  Vaping Use   Vaping status: Never Used  Substance Use Topics   Alcohol use: Never     Allergies   Patient has no known allergies.   Review of Systems Review of Systems  Respiratory:  Positive for cough.    Per HPI  Physical Exam Triage Vital Signs ED Triage Vitals  Encounter Vitals Group     BP      Systolic BP Percentile      Diastolic BP Percentile      Pulse      Resp      Temp      Temp src      SpO2      Weight      Height      Head Circumference  Peak Flow      Pain Score      Pain Loc      Pain Education      Exclude from Growth Chart    No data found.  Updated Vital Signs BP 125/83 (BP Location: Right Arm)   Pulse 78   Temp 98 F (36.7 C) (Oral)   Resp 17   LMP 03/06/2024   SpO2 100%    Physical Exam Vitals and nursing note reviewed.  Constitutional:      General: She is not in acute distress.    Appearance: She is not ill-appearing.  HENT:     Right Ear: Tympanic membrane and ear canal normal.     Left Ear: Tympanic membrane and ear canal normal.     Nose: Congestion present.     Mouth/Throat:     Mouth: Mucous membranes are moist.     Pharynx: Oropharynx is clear. No posterior oropharyngeal erythema.  Eyes:     Conjunctiva/sclera: Conjunctivae normal.  Cardiovascular:     Rate and Rhythm: Normal rate and regular rhythm.     Pulses: Normal pulses.     Heart sounds: Normal  heart sounds.  Pulmonary:     Effort: Pulmonary effort is normal. No respiratory distress.     Breath sounds: Normal breath sounds. No wheezing, rhonchi or rales.  Musculoskeletal:     Cervical back: Normal range of motion. No rigidity or tenderness.  Lymphadenopathy:     Cervical: No cervical adenopathy.  Skin:    General: Skin is warm and dry.  Neurological:     Mental Status: She is alert and oriented to person, place, and time.     UC Treatments / Results  Labs (all labs ordered are listed, but only abnormal results are displayed) Labs Reviewed - No data to display   EKG  Radiology No results found.  Procedures Procedures (including critical care time)  Medications Ordered in UC Medications - No data to display  Initial Impression / Assessment and Plan / UC Course  I have reviewed the triage vital signs and the nursing notes.  Pertinent labs & imaging results that were available during my care of the patient were reviewed by me and considered in my medical decision making (see chart for details).  Afebrile, sating 100% room air, clear lungs With 2 week duration congestion and cough will cover with doxycycline BID x 7 days. Also recommend albuterol inhaler use TID; refill is sent with spacer. Can also try promethazine DM at night. Advised reasons to return to clinic. Patient agrees to plan, all questions answered. Note for work provided   Final Clinical Impressions(s) / UC Diagnoses   Final diagnoses:  Acute bacterial sinusitis  Acute cough  Mild persistent asthma without complication     Discharge Instructions      Please take doxycycline as prescribed. Take with food to avoid upset stomach. Finish the full course!  The promethazine DM cough syrup can be used up to 4 times daily. If this medication makes you drowsy, take only once before bed.  You can also use inhaler 3 times daily for the next several days. Use with spacer to maximize the amount of  medication that enters the lungs.  Please return if symptoms have not improved after the doxycycline course.      ED Prescriptions     Medication Sig Dispense Auth. Provider   albuterol (VENTOLIN HFA) 108 (90 Base) MCG/ACT inhaler Inhale 2 puffs into the lungs every  6 (six) hours as needed for wheezing or shortness of breath. 8 g Natajah Derderian, PA-C   promethazine-dextromethorphan (PROMETHAZINE-DM) 6.25-15 MG/5ML syrup Take 5 mLs by mouth 4 (four) times daily as needed for cough. 240 mL Coby Shrewsberry, PA-C   Spacer/Aero-Holding Chambers (AEROCHAMBER PLUS FLO-VU MEDIUM) MISC 1 spacer for use with albuterol inhaler 1 each Ej Pinson, PA-C   doxycycline (VIBRAMYCIN) 100 MG capsule Take 1 capsule (100 mg total) by mouth 2 (two) times daily for 7 days. 14 capsule Aislynn Cifelli, Lurena Joiner, PA-C      PDMP not reviewed this encounter.   Marlow Baars, [place] 03/06/24 [id number]

## 2024-03-06 NOTE — ED Triage Notes (Signed)
 Pt c/o cough and nasal congestion x 2 weeks. Some facial pain. Cough worse at night. Taking mucinex and ibuprofen prn.

## 2024-03-06 NOTE — Discharge Instructions (Addendum)
 Please take doxycycline as prescribed. Take with food to avoid upset stomach. Finish the full course!  The promethazine DM cough syrup can be used up to 4 times daily. If this medication makes you drowsy, take only once before bed.  You can also use inhaler 3 times daily for the next several days. Use with spacer to maximize the amount of medication that enters the lungs.  Please return if symptoms have not improved after the doxycycline course.
